# Patient Record
Sex: Female | Born: 1960 | Race: White | Hispanic: No | Marital: Married
Health system: Midwestern US, Community
[De-identification: ages and names within clinical notes are randomized; demographics above are authoritative.]

## PROBLEM LIST (undated history)

## (undated) DIAGNOSIS — R300 Dysuria: Secondary | ICD-10-CM

## (undated) DIAGNOSIS — N958 Other specified menopausal and perimenopausal disorders: Secondary | ICD-10-CM

## (undated) DIAGNOSIS — N950 Postmenopausal bleeding: Secondary | ICD-10-CM

---

## 2017-08-29 NOTE — Telephone Encounter (Signed)
Judy DandyMary would like to transfer her care to you from Dr. Woodroe ChenShivers. Seen you in [facility].     Okay to schedule?

## 2017-08-29 NOTE — Telephone Encounter (Signed)
No, I am not accepting new patients. Sorry!

## 2017-08-30 NOTE — Telephone Encounter (Signed)
Informed.

## 2017-10-21 ENCOUNTER — Ambulatory Visit: Admit: 2017-10-21 | Discharge: 2017-10-21 | Payer: PRIVATE HEALTH INSURANCE | Attending: Physician Assistant

## 2017-10-21 DIAGNOSIS — R079 Chest pain, unspecified: Secondary | ICD-10-CM

## 2017-10-21 LAB — COMPREHENSIVE METABOLIC PANEL
ALT: 21 U/L
AST: 27 U/L
Albumin: 4.5
Alkaline Phosphatase: 45 U/L
Anion Gap: 1.6 mmol/L
BUN: 9 mg/dL
CO2: 27 mmol/L
Calcium: 10.1 mg/dL
Chloride: 104 mmol/L
Creatinine: 0.7
Est, Glom Filt Rate: 86
Glucose: 111 mg/dL — ABNORMAL HIGH
Potassium: 3.6 mmol/L
Sodium: 141 mmol/L
Total Bilirubin: 0.8 mg/dL (ref 0.1–1.4)
Total Protein: 7.3

## 2017-10-21 LAB — CBC
Basophils %: 0.6 %
Basophils Absolute: 0 /uL
Eosinophils %: 0.9 %
Eosinophils Absolute: 0 /uL
Hematocrit: 40.1 % (ref 36–46)
Hemoglobin: 13.7 g/dL (ref 12.0–16.0)
Lymphocytes %: 18.9 % — ABNORMAL LOW
Lymphocytes Absolute: 0.9 /uL — ABNORMAL LOW
MCH: 30.2 pg
MCHC: 34.1 g/dL
MCV: 88.6 fL
MPV: 9 fL
Monocytes %: 6.4 %
Monocytes Absolute: 0.3 /uL
Neutrophils %: 73.2 %
Neutrophils Absolute: 3.3 /uL
Platelets: 202 K/ÂµL
RBC: 4.53 10^6/ÂµL
WBC: 4.6 10^3/mL

## 2017-10-21 NOTE — Progress Notes (Signed)
10/21/17  Judy StackMary Sutton DOB: 1961-01-28 Sex: female  Age 89 y.o.      Subjective:  Chief Complaint   Patient presents with   ??? Shoulder Pain         HPI:   Judy Sutton , 57 y.o. female presents to express care for evaluation of left shoulder pain.  The patient has had this left shoulder pain ongoing for the last week or so.  Seem to get worse yesterday and she has not had any trauma to the left shoulder.  She was recently started on Trintellix for depression/anxiety.  The patient states that she was reading some of the side effects of the medication and it can cause headaches and dizziness.  The patient stopped taking the medication over the last weekend.  She restarted the medication this week and started to develop pain in the left shoulder.  At times it is very intense.  The patient states that she has been intermittently dizzy.  The patient is not having any fevers or chills.  No shortness of breath.  The pain does go into the left upper chest area and down the left arm.  She states that she has a history of hyperlipidemia but does not take any medications for.  She does not have any history of diabetes or hypertension.  No previous CAD history.      ROS:   Unless otherwise stated in this report the patient's positive and negative responses for review of systems for constitutional, eyes, ENT, cardiovascular, respiratory, gastrointestinal, neurological, GU, musculoskeletal, and integument systems and related systems to the presenting problem are either stated in the history of present illness or were not pertinent or were negative for the symptoms and/or complaints related to the presenting medical problem.  Positives and pertinent negatives as per HPI.  All others reviewed and are negative.      PMH:     Past Medical History:   Diagnosis Date   ??? Anxiety    ??? HLD (hyperlipidemia)        History reviewed. No pertinent surgical history.    Family History   Problem Relation Age of Onset   ??? No Known Problems Mother     ??? No Known Problems Father        Medications:     Current Outpatient Medications:   ???  LORazepam (ATIVAN) 0.5 MG tablet, Take by mouth., Disp: , Rfl:   ???  estradiol (CLIMARA) 0.05 MG/24HR, APPLY 1 PATCH ONCE WEEKLY AS DIRECTED, Disp: , Rfl: 4  ???  progesterone (PROMETRIUM) 100 MG capsule, TAKE ONE CAPSULE BY MOUTH EVERY DAY, Disp: , Rfl: 3    Allergies:   No Known Allergies    Social History:     Social History     Tobacco Use   ??? Smoking status: Never Smoker   ??? Smokeless tobacco: Never Used   Substance Use Topics   ??? Alcohol use: Never     Frequency: Never   ??? Drug use: Never       Patient lives at home.    Physical Exam:     Vitals:    10/21/17 1249   BP: 130/72   Pulse: 88   Temp: 97.9 ??F (36.6 ??C)   SpO2: 98%       Exam:  Physical Exam  Nurses note and vital signs reviewed and patient is not hypoxic.    General: The patient appears well and in no apparent distress. Patient is resting comfortably on  cart.  Skin: Warm, dry, no pallor noted. There is no rash noted.  Head: Normocephalic, atraumatic.  Eye: Normal conjunctiva  Ears, Nose, Mouth, and Throat: Oral mucosa is moist  Cardiovascular: Regular Rate and Rhythm  Respiratory: Patient is in no distress, no accessory muscle use, lungs are clear to auscultation, no wheezing, crackles or rhonchi  Back: Non-tender, no CVA tenderness bilaterally to percussion.  GI: Normal bowel sounds, no tenderness to palpation, no masses appreciated. No rebound, guarding, or rigidity noted.  Musculoskeletal: The patient has no evidence of calf tenderness, no pitting edema, symmetrical pulses noted bilaterally  Neurological: A&O x4, normal speech        Testing:     EKG shows normal sinus rhythm, rate of 67, normal axis, no ST segment elevation, there is some depression in lead II, T wave inversion in V1 and V2, baseline artifact in V3.  Incomplete right bundle branch block.  Left atrial enlargement.  Normal intervals.      Medical Decision Making:     The patient had a relatively  benign physical exam.  Vital signs reviewed and noted to be within normal limits.  Will obtain EKG.    The patient's EKG does show some evidence of ST segment depression in lead II as well as T wave inversion V1 and V2.  The patient is having some atypical signs and symptoms.  She is dizzy.  Based on the nature of the patient's symptoms we do not have an old EKG to compare to we will send the patient to the emergency department for further evaluation and rule out ACS.  I offered to call the patient and EMS transport.  The patient is declining at this time.  The patient will transport herself to the ED.  She was comfortable with this plan.  Patient has no other questions at this time.      Clinical Impression:   [name] was seen today for shoulder pain.    Diagnoses and all orders for this visit:    Chest pain, unspecified type  -     EKG 12 lead; Future  -     EKG 12 lead    Acute pain of left shoulder    Dizziness      go directly to the emergency department.     SIGNATURE: Alben Spittle III, PA-C

## 2017-10-25 NOTE — Telephone Encounter (Signed)
Pt was seen in walk in care and sent to [facility] ER on 8/23 due to lt shoulder pain, dizziness, and abnormal EKG, was told to d/c anti-depressant because symptoms were possible due to med.  Pt scheduled appt at next available appt slot 9/17 but requests sooner appt if possible.  She was added to the wait list.

## 2017-11-15 ENCOUNTER — Ambulatory Visit: Admit: 2017-11-15 | Discharge: 2017-11-15 | Payer: PRIVATE HEALTH INSURANCE | Attending: Family Medicine

## 2017-11-15 DIAGNOSIS — R079 Chest pain, unspecified: Secondary | ICD-10-CM

## 2017-11-15 NOTE — Progress Notes (Signed)
11/15/2017     Judy Sutton    DOB: [birth year] Sex: female   Age: 57 y.o.    Chief Complaint   Patient presents with   . Follow-Up from [facility]     [facility] for dizziness, shoulder pain, abnormal ekg,       HPI: This 57 y.o. -year-old female  presents today for follow-up evaluation from a recent emergency room visit.  The patient was sent to the emergency room through express care with an abnormal EKG and atypical signs and symptoms of ACS.  The patient was discharged with a diagnosis of noncardiac chest pain as well as left shoulder pain.  The patient states the pain has resolved.  Current medication list reviewed. The patient is tolerating all medications well without adverse events or known side effects. The patient does understand the risk and benefits of the prescribed medications. The patient is up-to-date on all age-appropriate wellness issues.      ROS:   Const: Denies changes in appetite, chills, fever, night sweats and weight loss.   Eyes:  Denies discharge, a recent change in visual acuity, blurred vision and double vision.  ENMT: Denies discharge of the ears, hearing loss, pain of the ears. Denies nasal or sinus symptoms other than stated above.  Denies mouth or throat symptoms.  CV:  Denies chest pain, dyspnea on exertion, orthopnea, palpitations and PND  Resp: Denies chest pain, cough, SOB and wheezing.  GI: Denies abdominal pain, constipation, diarrhea, heartburn, indigestion, nausea and vomiting.   GU: Denies dysuria, frequency, hematuria, nocturia and urgency.  Musculo: Denies arthralgias and myalgia  Skin:  Denies lesions, pruritus and rash.  Neuro: Denies dizziness, lightheadedness, numbness, tingling and weakness.  Psych:  Denies anxiety and depression  Endocrine: Denies anxiety and depression.  Hema/Lymph: Denies hematologic symptoms  Allergy/Immuno:  Denies allergic/immunologic symptoms.  Pertinent positives reviewed and noted      Current Outpatient Medications:   .  estradiol (VIVELLE) 0.05  MG/24HR, APPLY ONE PATCH TWO TIMES PER WEEK. LEAVE PATCH ON FOR 3 DAYS AND THEN REPLACE, Disp: , Rfl: 4  .  nitrofurantoin, macrocrystal-monohydrate, (MACROBID) 100 MG capsule, Take 100 mg by mouth 2 times daily, Disp: , Rfl:   .  phenazopyridine (PYRIDIUM) 200 MG tablet, Take 200 mg by mouth every 8 hours For 2 days, Disp: , Rfl:   .  LORazepam (ATIVAN) 0.5 MG tablet, Take by mouth., Disp: , Rfl:   .  estradiol (CLIMARA) 0.05 MG/24HR, APPLY 1 PATCH ONCE WEEKLY AS DIRECTED, Disp: , Rfl: 4  .  progesterone (PROMETRIUM) 100 MG capsule, TAKE ONE CAPSULE BY MOUTH EVERY DAY, Disp: , Rfl: 3    No Known Allergies    Past Medical History:   Diagnosis Date   . Anxiety    . HLD (hyperlipidemia)      Social History     Socioeconomic History   . Marital status: Married     Spouse name: Not on file   . Number of children: Not on file   . Years of education: Not on file   . Highest education level: Not on file   Occupational History   . Not on file   Social Needs   . Financial resource strain: Not on file   . Food insecurity:     Worry: Not on file     Inability: Not on file   . Transportation needs:     Medical: Not on file     Non-medical: Not on file  Tobacco Use   . Smoking status: Never Smoker   . Smokeless tobacco: Never Used   Substance and Sexual Activity   . Alcohol use: Never     Frequency: Never   . Drug use: Never   . Sexual activity: Not on file   Lifestyle   . Physical activity:     Days per week: Not on file     Minutes per session: Not on file   . Stress: Not on file   Relationships   . Social connections:     Talks on phone: Not on file     Gets together: Not on file     Attends religious service: Not on file     Active member of club or organization: Not on file     Attends meetings of clubs or organizations: Not on file     Relationship status: Not on file   . Intimate partner violence:     Fear of current or ex partner: Not on file     Emotionally abused: Not on file     Physically abused: Not on file      Forced sexual activity: Not on file   Other Topics Concern   . Not on file   Social History Narrative   . Not on file     No past surgical history on file.   Family History   Problem Relation Age of Onset   . Heart Failure Mother    . Diabetes Mother         Vitals:    11/15/17 0913   BP: 126/78   Pulse: 62   Resp: 16   SpO2: 98%   Weight: 121 lb (54.9 kg)   Height: 5\' 6"  (1.676 m)        Exam: Const: Appears healthy and well developed.  No signs of acute distress present.  Eyes: PERRL  ENMT: Tympanic membranes are intact.  Nasal mucosa intact without noted erythema Septum is in the midline.  Posterior pharynx shows no exudate, irritation or redness.  Neck:  Supple without adenopathy.  Adequate range of motion   Resp: No rales, rhonchi, wheezes appreciated over the lungs bilaterally.   CV: S1, S2 within normal limits.  Regular rate and rhythm noted.  Without murmur, gallop or rub.     Extremities:  Pulses intact.  Without noted edema.  Abdomen: Positive bowel sounds.  Palpation reveals softness, with no distension, organomegaly or tenderness.  No abdominal masses palpable.  Skin: Skin is warm and dry.  Musculo: Unremarkable upon examination.  Neuro: Alert and oriented X3.  Cranial nerves grossly intact.   Psych: Mood is normal.  Affect is normal.   Vital signs reviewed.      Controlled Substances Monitoring:     No flowsheet data found.     Plan Per Assessment:  There are no diagnoses linked to this encounter.    Return in about 7 months (around 06/16/2018) for Wellness examination.    Godson Pollan Daine Floras, MD    Note was generated with the assistance of voice recognition software.  Document was reviewed however may contain grammatical errors.

## 2017-12-06 NOTE — Telephone Encounter (Signed)
Pt seen in the office 11/15/17.

## 2018-08-30 ENCOUNTER — Ambulatory Visit: Admit: 2018-08-30 | Discharge: 2018-08-30 | Payer: PRIVATE HEALTH INSURANCE | Attending: Physician Assistant

## 2018-08-30 DIAGNOSIS — R42 Dizziness and giddiness: Secondary | ICD-10-CM

## 2018-08-30 LAB — POCT GLUCOSE: Glucose: 98 mg/dL

## 2018-08-30 NOTE — Telephone Encounter (Signed)
Pend referral.  Okay for EKG.

## 2018-08-30 NOTE — Telephone Encounter (Signed)
Patient called her cardiologist it has been a couple of years since she was there.  They are requesting a referral.  [facility] Dr. Gardiner Rhyme.  Fax number is [phone].  They also would like EKG done today.    Electronically signed by Carylon Perches, LPN on 06/30/8634 at 9:45 AM

## 2018-08-30 NOTE — Progress Notes (Deleted)
08/30/2018   [facility] [place]  [address]  [place] [address]  [phone]    Judy Sutton  DOB: Jan 28, 1961  Age: 58 y.o.  Sex: female      Subjective:  Chief Complaint   Patient presents with   ??? Dizziness       HPI: The patient states that they have been dizzy for the last *** days. The dizziness is worse with movement. No nausea and vomiting. No numbness, tingling, or weakness to the extremities. No headache or visual disturbances. No recent head injury. Bowel movements have been normal color and consistency. No diarrhea. No black or bloody stools. The patient denies dysuria, urinary frequency, urgency and or gross hematuria. No flank pain. No fevers or chills. No chest pain or dyspnea. They come to the urgent care for evaluation.    ROS:  Positive and pertinent negatives as per HPI.  All other systems are reviewed and negative.       Current Outpatient Medications:   ???  estradiol (VIVELLE) 0.05 MG/24HR, APPLY ONE PATCH TWO TIMES PER WEEK. LEAVE PATCH ON FOR 3 DAYS AND THEN REPLACE, Disp: , Rfl: 4  ???  LORazepam (ATIVAN) 0.5 MG tablet, Take by mouth., Disp: , Rfl:   ???  estradiol (CLIMARA) 0.05 MG/24HR, APPLY 1 PATCH ONCE WEEKLY AS DIRECTED, Disp: , Rfl: 4  ???  progesterone (PROMETRIUM) 100 MG capsule, TAKE ONE CAPSULE BY MOUTH EVERY DAY, Disp: , Rfl: 3   No Known Allergies     Objective:  Vitals:    08/30/18 0831   BP: 120/80   Pulse: 89   Temp: 98.7 ??F (37.1 ??C)   TempSrc: Temporal   SpO2: 98%   Weight: 117 lb (53.1 kg)        Exam:  Const: Appears healthy and well developed. No signs of acute distress present.  Vitals reviewed per triage.  Head/Face: Normocephalic, atraumatic.  Facies is symmetric.  Eyes: Pupils are equal, round, and reactive to light.  Extraocular movements are intact.   ENMT: Tympanic membranes are pearly gray with good light reflex bilaterally.  Nares are patent. Buccal mucosa was moist.   Posterior pharynx shows no exudate, irritation or redness.   Neck: Supple and symmetric. Palpation  reveals no adenopathy. Trachea midline.  Resp: Lungs are clear bilaterally.  CV: Rhythm is regular. S1 is normal. S2 is normal. Extremities: No edema of the lower limbs bilaterally.  Abdomen: Abdomen soft, nontender to palpation.  No masses or organomegaly.  No rebound or guarding.     Musculo: Walks with a normal gait. Patient moves extremities without pain or limitation.  Muscle strength is strong and equal bilaterally.    Skin: Skin is warm and dry.  Neuro: Alert and oriented x3. Speech is articulate and fluent.No focal neurologic deficits.  Psych: Mood and affect are appropriate.    Judy Sutton was seen today for dizziness.    Diagnoses and all orders for this visit:    Dizziness  -     POCT Glucose        No follow-ups on file.    Seen By:    Georgiann Cocker, PA-C

## 2018-08-30 NOTE — Telephone Encounter (Signed)
Patient was in to urgent care and advised to go to ER. Patient had refused.     She called back later requesting referral to cardiologist.  Patient informed she needs to go to ER. Still refuses.  [facility] informed.    Electronically signed by Carylon Perches, LPN on 03/09/6343 at 10:40 AM

## 2018-08-30 NOTE — Progress Notes (Signed)
08/30/2018   [name]  [address]  [place] [address]  [phone]    Judy Sutton  DOB: 1960-04-09  Age: 58 y.o.  Sex: female      Subjective:  Chief Complaint   Patient presents with   ??? Dizziness       HPI: The patient states that they have been dizzy for many months but the dizziness has changed in the last 2 to 3 weeks.  Patient states she is struggled with dizziness for close to a year usually with position changes.  However in the last 2 or 3 weeks she states that not only has she gotten dizzy with position changes but she has also gotten extremely lightheaded multiple times when she felt as though she would actually pass out and having very mild chest discomfort in her left anterior chest.  Patient states yesterday she felt as though she may pass out 3 separate occasions.  Is unable to recall exactly what she was doing when she felt as though she may pass out.  The last time she had mild chest discomfort was this morning on her way into express care.  Patient states it was a very mild tight feeling that lasted for approximately 10 or 15 minutes.  During this 10 to 15 minutes when she was driving experienced chest tightness she denies that she had any associated symptoms.  Patient denies that she has been on any new medications or supplements.  She has not had any changes in her daily routine.. The dizziness is worse with movement. No nausea and vomiting. No numbness, tingling, or weakness to the extremities.  Patient reports slight headache but no visual disturbances. No recent head injury. Bowel movements have been normal color and consistency. No diarrhea. No black or bloody stools. The patient denies dysuria, urinary frequency, urgency and or gross hematuria. No flank pain. No fevers or chills. No  dyspnea. They come to the urgent care for evaluation.    ROS:  Positive and pertinent negatives as per HPI.  All other systems are reviewed and negative.       Current Outpatient Medications:   ???   estradiol (VIVELLE) 0.05 MG/24HR, APPLY ONE PATCH TWO TIMES PER WEEK. LEAVE PATCH ON FOR 3 DAYS AND THEN REPLACE, Disp: , Rfl: 4  ???  LORazepam (ATIVAN) 0.5 MG tablet, Take by mouth., Disp: , Rfl:   ???  estradiol (CLIMARA) 0.05 MG/24HR, APPLY 1 PATCH ONCE WEEKLY AS DIRECTED, Disp: , Rfl: 4  ???  progesterone (PROMETRIUM) 100 MG capsule, TAKE ONE CAPSULE BY MOUTH EVERY DAY, Disp: , Rfl: 3   No Known Allergies     Objective:  Vitals:    08/30/18 0831   BP: 120/80   Pulse: 89   Temp: 98.7 ??F (37.1 ??C)   TempSrc: Temporal   SpO2: 98%   Weight: 117 lb (53.1 kg)        Exam:  Const: Appears healthy and well developed. No signs of acute distress present.  Vitals reviewed per triage.  Head/Face: Normocephalic, atraumatic.  Facies is symmetric.  Eyes: Pupils are equal, round, and reactive to light.  Extraocular movements are intact.   ENMT: Tympanic membranes are pearly gray with good light reflex bilaterally.  Nares are patent. Buccal mucosa was moist.   Posterior pharynx shows no exudate, irritation or redness.   Neck: Supple and symmetric. Palpation reveals no adenopathy. Trachea midline.  Resp: Lungs are clear bilaterally.  CV: Rhythm is regular.  S1 is normal. S2 is normal. Extremities: No edema of the lower limbs bilaterally.  Abdomen: Abdomen soft, nontender to palpation.  No masses or organomegaly.  No rebound or guarding.     Musculo: Walks with a normal gait. Patient moves extremities without pain or limitation.  Muscle strength is strong and equal bilaterally.    Skin: Skin is warm and dry.  Neuro: Alert and oriented x3. Speech is articulate and fluent.No focal neurologic deficits.  Psych: Mood and affect are appropriate.    Judy DandyMary was seen today for dizziness.    Diagnoses and all orders for this visit:    Dizziness  -     POCT Glucose  -     EKG 12 lead; Future  -     EKG 12 lead    Pre-syncope    Chest tightness        EKG done here in the office.  Shows a sinus rhythm of 60.  Normal axis.  No ST segment elevation.   There is T wave inversions in V1 V2 and aVL.  Incomplete right bundle branch block is also noted.  This was compared to an EKG that was done in August 2019 and is unchanged.      Patient states she has seen cardiology in the past.  She states that she has been diagnosed with mitral valve prolapse but never followed up with cardiology after the abnormal EKG that was done in August 2019.  I did discuss with the patient that given her change in dizziness with the chest discomfort, I recommend the emergency department for further evaluation and treatment.  I did give patient a copy of her EKG.  I was under the impression that patient was driving herself to the ED for further evaluation and treatment.  However after patient left she called the office stating she tried to get into see her cardiologist instead.  It is been several years since she has seen cardiology and they are requiring a referral.  With this we did call the patient back and advised the ED for her current symptoms.  Told patient I am unwilling to do referral to cardiology as I believe this is urgent and she needs to go to the ED rather than wait for a cardiology appointment.  Patient states that she is uncertain what she will do at this point but she will consider going to the ED.  Pt refusal to go to ED was verified by Myriam JacobsonHelen, LPN.    Seen By:    Worthy RancherMELISSA J Leba Tibbitts, PA-C

## 2019-01-18 ENCOUNTER — Telehealth

## 2019-01-18 NOTE — Telephone Encounter (Signed)
[facility] sent for [place] to pend orders.      Orders pended for approval

## 2019-03-13 ENCOUNTER — Encounter

## 2019-03-14 LAB — ESTRADIOL: Estradiol: 29.5 pg/mL

## 2019-03-14 LAB — VITAMIN D 25 HYDROXY: Vit D, 25-Hydroxy: 41 ng/mL (ref 30–100)

## 2019-03-14 LAB — CORTISOL TOTAL: Cortisol: 22.82 ug/dL — ABNORMAL HIGH (ref 2.68–18.40)

## 2019-03-14 LAB — TSH: TSH: 6.48 u[IU]/mL — ABNORMAL HIGH (ref 0.270–4.200)

## 2019-03-15 LAB — TESTOSTERONE, FREE, TOTAL
Sex Hormone Binding: 134 nmol/L (ref 30–135)
Testosterone, Free: <1 pg/mL (ref 0.6–3.8)
Testosterone: 14 ng/dL — ABNORMAL LOW (ref 20–70)

## 2019-03-16 LAB — 17-HYDROXYPROGESTERONE: 17-OH Progesterone LCMS: 54.87 ng/dL (ref <=206.00)

## 2019-03-20 LAB — ESTRONE: Estrone: 39.1 pg/mL

## 2020-03-31 IMAGING — MR MRI CERVICAL SPINE WITHOUT CONTRAST
6 series · 38 of 48 positions shown · non-contrast
Comparison: none

﻿

Pertinent Hx:  Neck pain.
TECHNIQUE: Sagittal images with T1, T2, and STIR weighting are performed through the cervical spine. Axial images with T1 and T2 weighting are performed through the C2-3, C3-4, C4-5, C5-6, C6-7, and C7-T1 disc spaces.

[Series 2: T2 · sagittal · 3.0mm · 0.43mm/px · 6 of 15 slices shown (1 of 3)]
[im 1/15]
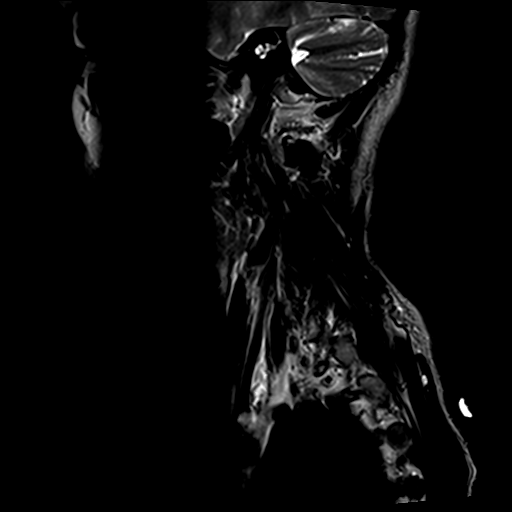
[im 3/15]
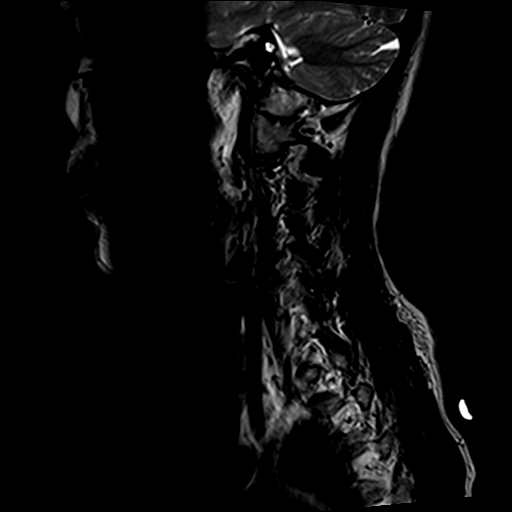
[im 6/15]
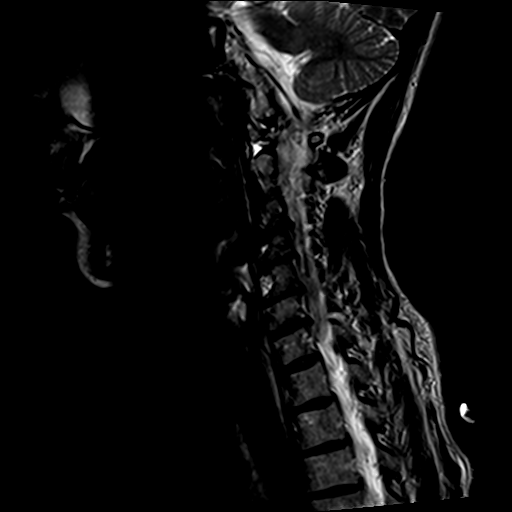
[im 9/15]
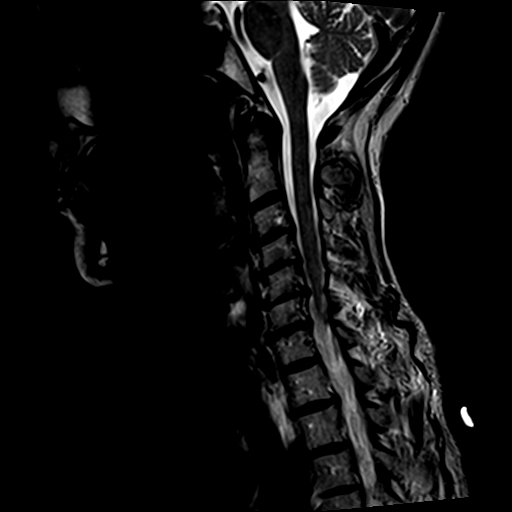
[im 12/15]
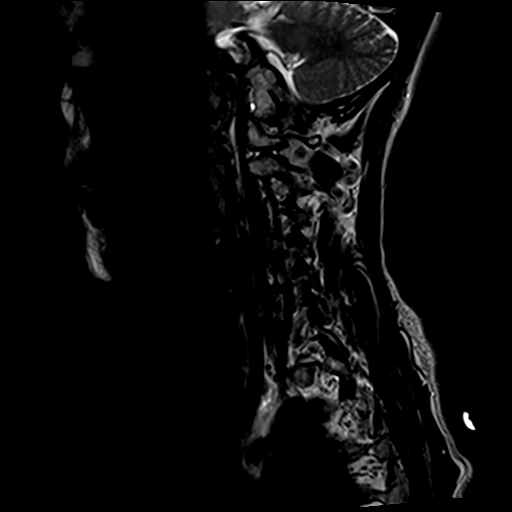
[im 15/15]
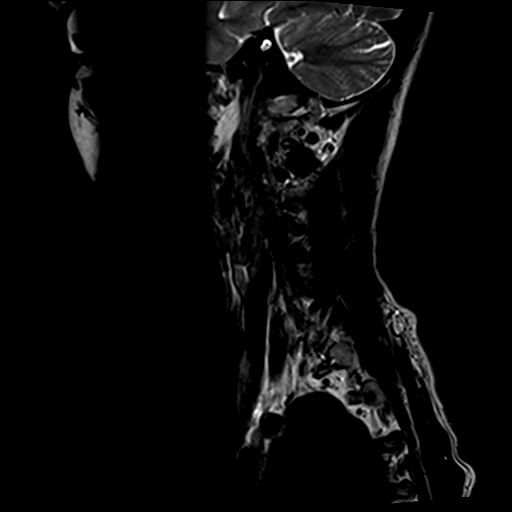

[Series 3: STIR · sagittal · 3.0mm · 0.43mm/px · 2 of 15 slices shown]
[im 1/15]
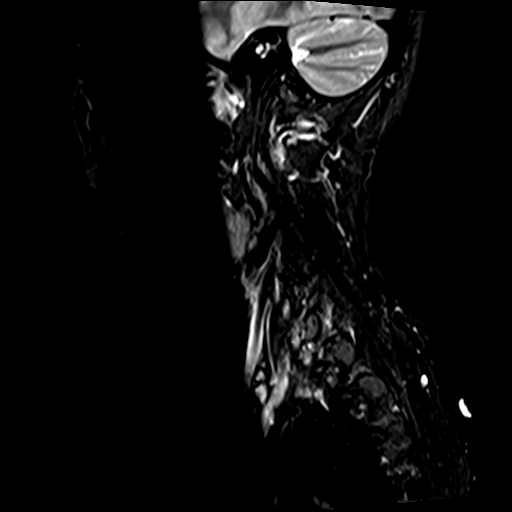
[im 3/15]
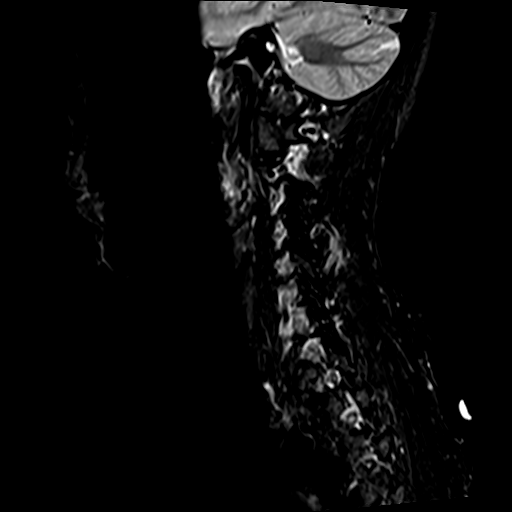

[Series 4: T2 · axial · 4.0mm · 0.59mm/px · z∈[-102,-5]mm · 8 of 26 slices shown (2 of 3)]
[im 1/26]
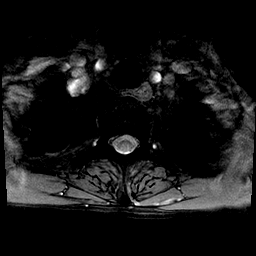
[im 3/26]
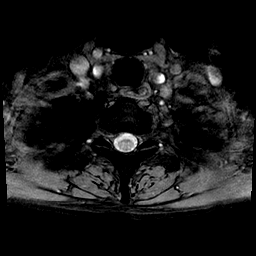
[im 9/26]
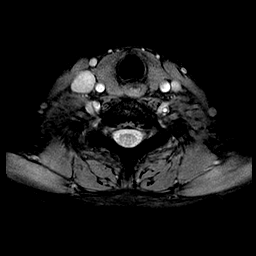
[im 12/26]
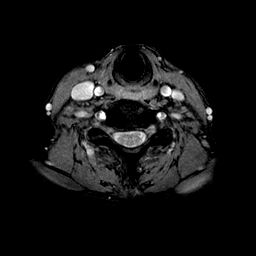
[im 14/26]
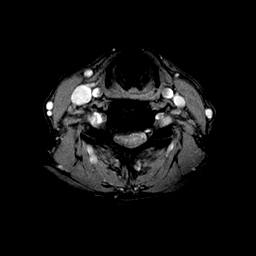
[im 17/26]
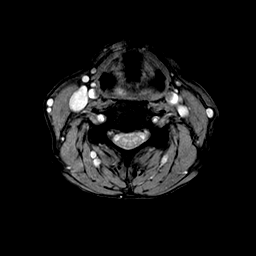
[im 23/26]
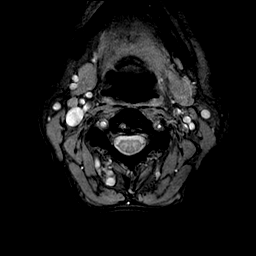
[im 26/26]
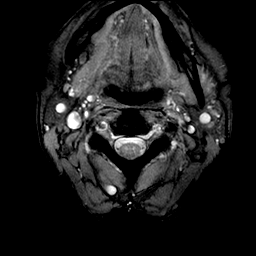

[Series 5: T1 · axial · 4.0mm · 0.59mm/px · z∈[-102,-5]mm · 8 of 26 slices shown (1 of 2)]
[im 1/26]
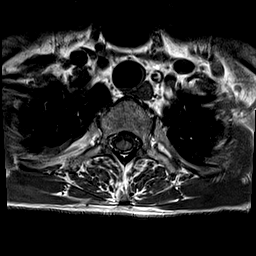
[im 3/26]
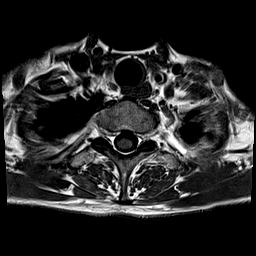
[im 9/26]
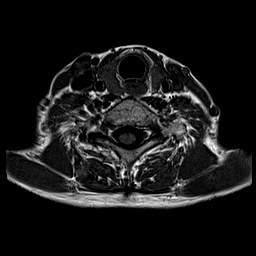
[im 12/26]
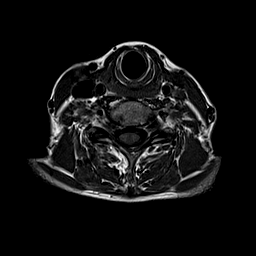
[im 14/26]
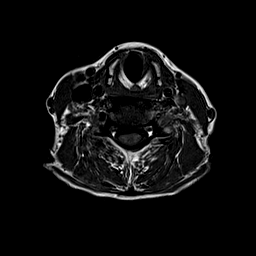
[im 17/26]
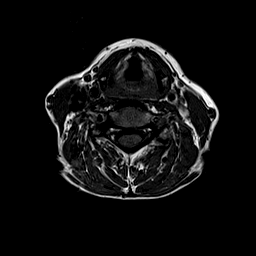
[im 23/26]
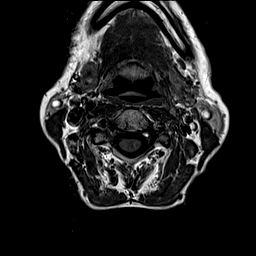
[im 26/26]
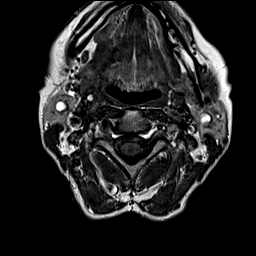

[Series 6: T2 · axial · 4.0mm · 0.47mm/px · z∈[-102,-5]mm · 8 of 26 slices shown (3 of 3)]
[im 1/26]
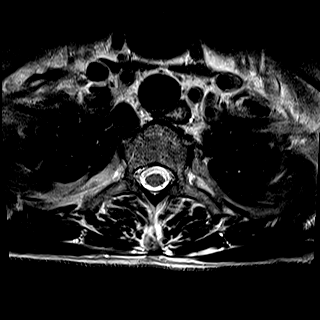
[im 3/26]
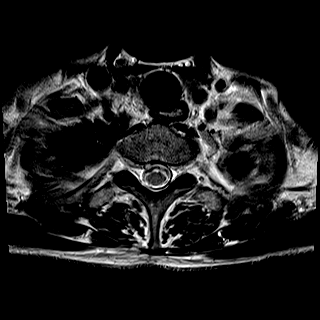
[im 9/26]
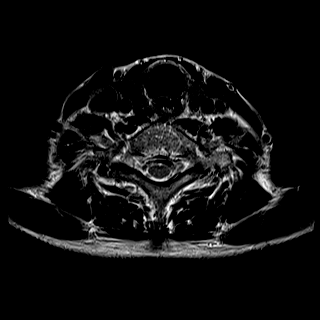
[im 12/26]
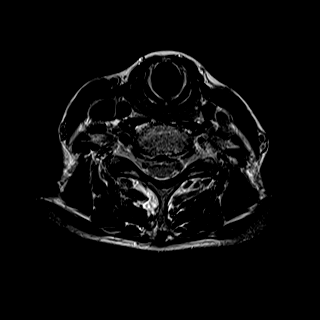
[im 14/26]
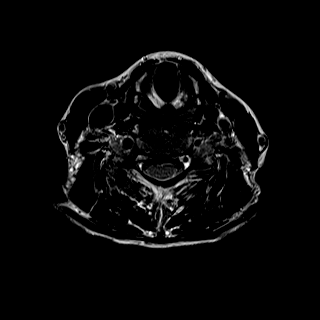
[im 17/26]
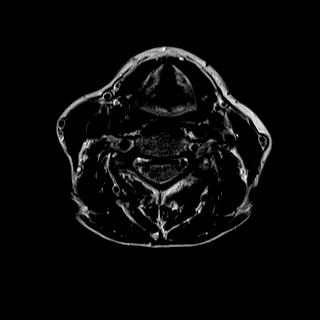
[im 23/26]
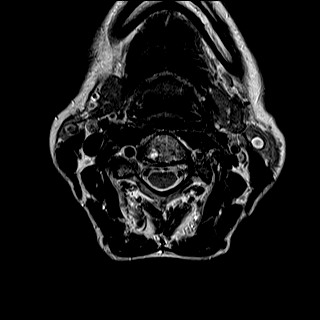
[im 26/26]
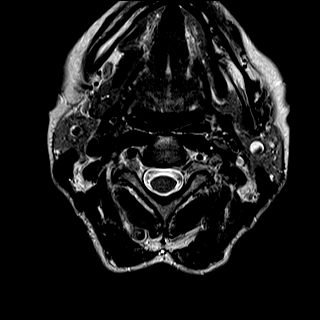

[Series 7: T1 · sagittal · 3.0mm · 0.69mm/px · 6 of 15 slices shown (2 of 2)]
[im 1/15]
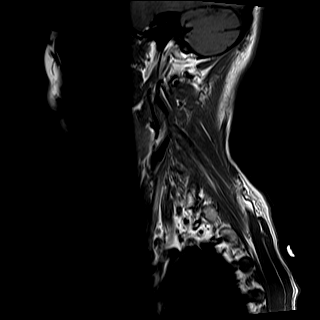
[im 3/15]
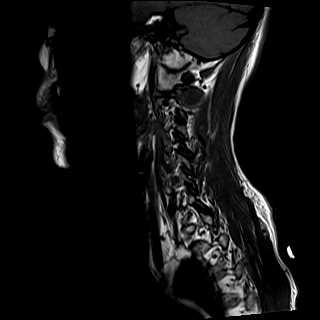
[im 6/15]
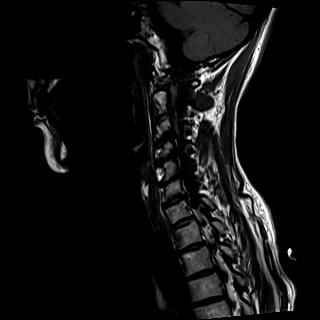
[im 9/15]
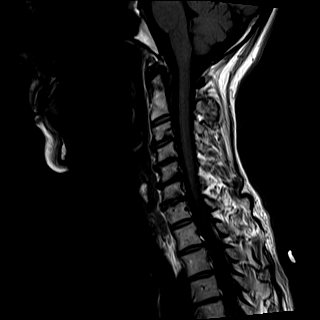
[im 12/15]
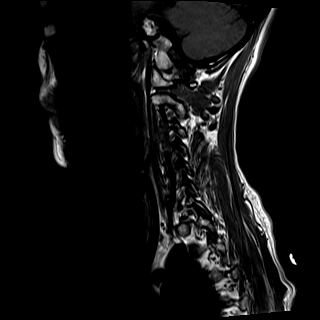
[im 15/15]
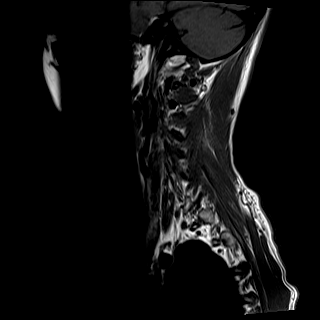

[38 of 48 positions shown; findings below may reference images not displayed]

FINDINGS: There is dramatic straightening to the normal cervical curve.  

C2-C3 is otherwise normal.  

A broad-based disc bulge at C3-C4 produces no significant canal or foraminal stenosis.  

At C4-C5, there is mild right foraminal stenosis due to right uncovertebral joint hypertrophy.  Image 9 series 4. 

At C5-C6, a broad-based midline and right-sided disc herniation results in mild midline and right neural canal stenosis.  The herniation extends into the right C5-C6 foramen to potentially efface the exiting right C6 nerve root.  Image 13 series 4.  No myelomalacia is present.  

At C6-C7, a broad-based midline and left posterior herniation effaces the subarachnoid space resulting in mild midline and left neural canal stenosis.  Image 16 series 4.  

C7-T1 is normal.
IMPRESSION: Abnormal due to multilevel spondylitic change which is present principally from C4-C5 to C6-C7 as described in the text of the report.  There is significant straightening to the normal cervical curve.

## 2020-03-31 IMAGING — MR MRI LUMBAR SPINE WITHOUT CONTRAST
5 series · 48 of 48 positions shown · non-contrast
Comparison: none

﻿

Pertinent Hx:  Bilateral leg pain and numbness.
TECHNIQUE: Sagittal images with T1, T2, and STIR weighting are performed through the lumbar spine. Axial images with T1 weighting are performed consecutively from L2 to S1. Additional axials with T2 weighting are performed from L1-L2 through L5-S1.

[Series 2: T2 · sagittal · 3.5mm · 0.81mm/px · 8 of 15 slices shown (1 of 2)]
[im 1/15]
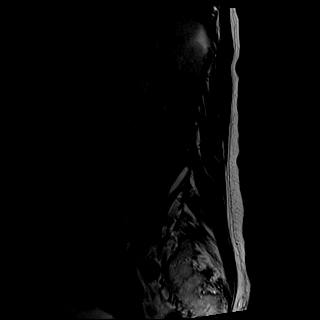
[im 3/15]
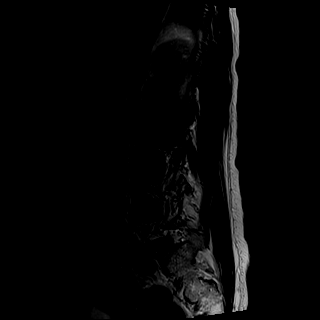
[im 5/15]
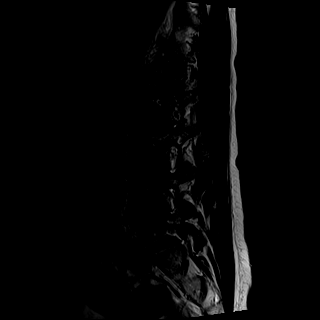
[im 7/15]
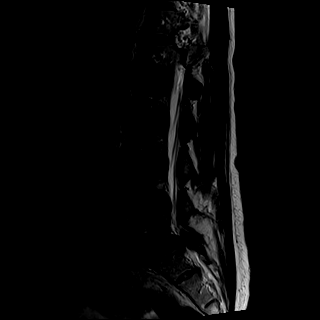
[im 9/15]
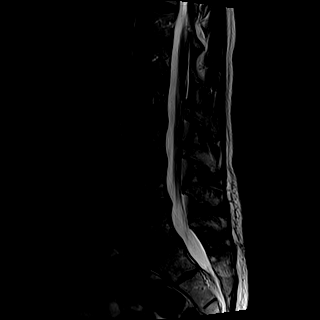
[im 11/15]
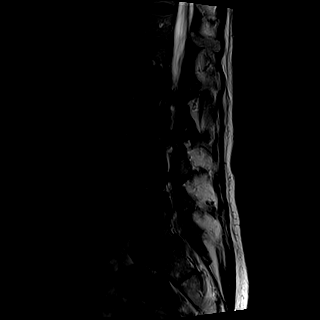
[im 13/15]
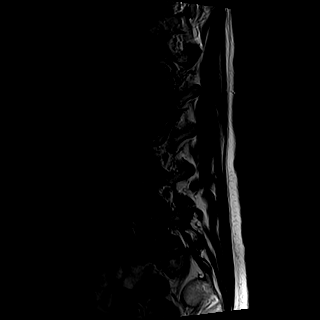
[im 15/15]
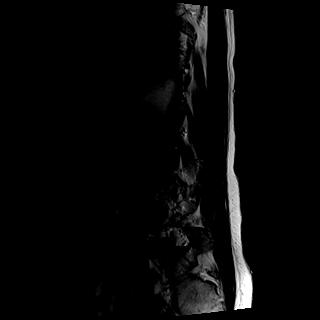

[Series 3: T1 · sagittal · 3.5mm · 0.81mm/px · 7 of 15 slices shown (1 of 2)]
[im 1/15]
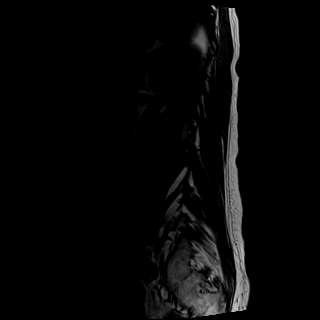
[im 3/15]
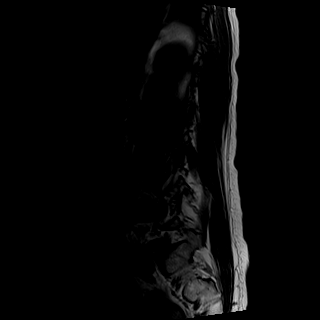
[im 5/15]
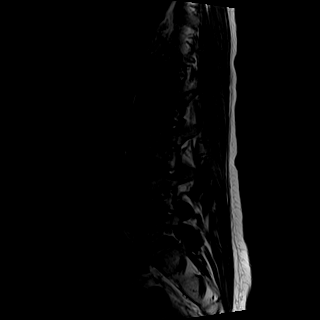
[im 8/15]
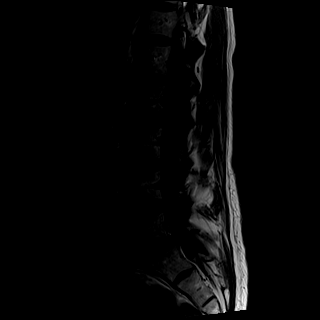
[im 10/15]
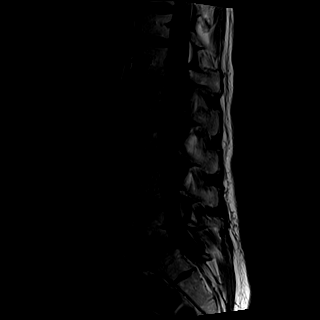
[im 12/15]
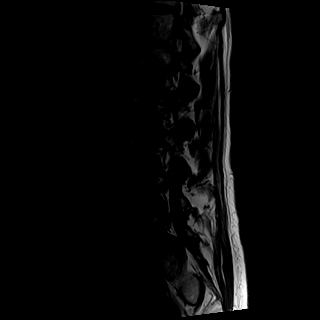
[im 15/15]
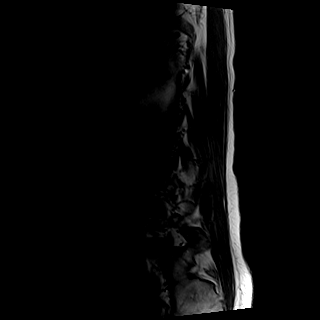

[Series 5: T1 · axial · 4.0mm · 0.66mm/px · z∈[-49,+126]mm · 13 of 26 slices shown (2 of 2)]
[im 1/26]
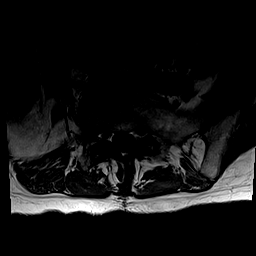
[im 3/26]
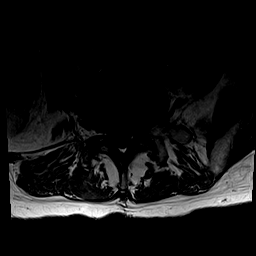
[im 5/26]
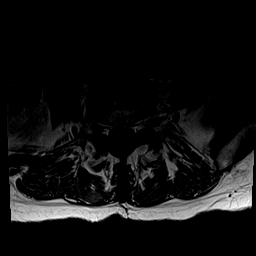
[im 7/26]
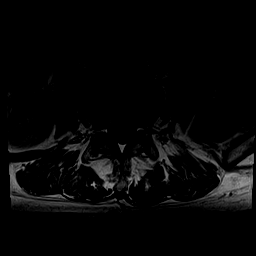
[im 9/26]
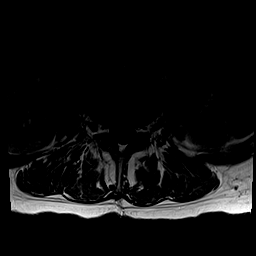
[im 11/26]
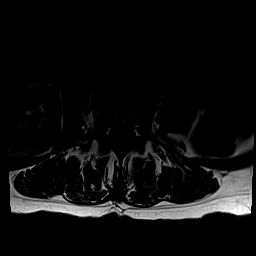
[im 13/26]
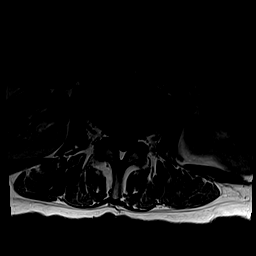
[im 15/26]
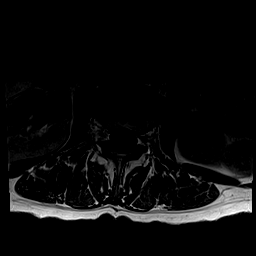
[im 17/26]
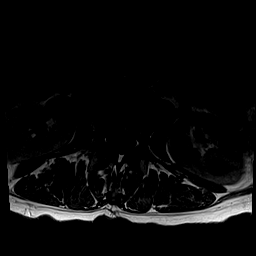
[im 19/26]
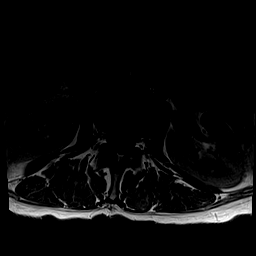
[im 21/26]
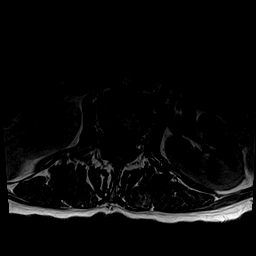
[im 23/26]
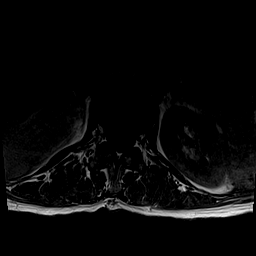
[im 26/26]
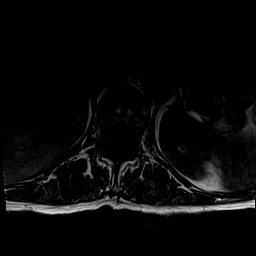

[Series 6: T2 · axial · 4.0mm · 0.66mm/px · z∈[-49,+126]mm · 13 of 26 slices shown (2 of 2)]
[im 1/26]
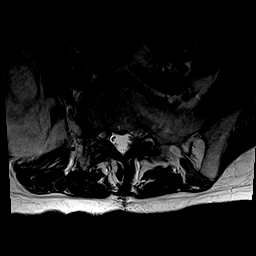
[im 3/26]
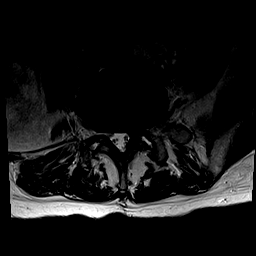
[im 5/26]
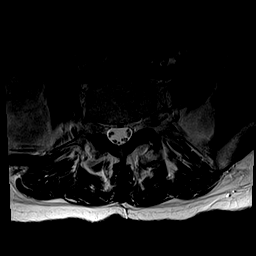
[im 7/26]
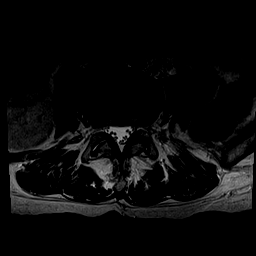
[im 9/26]
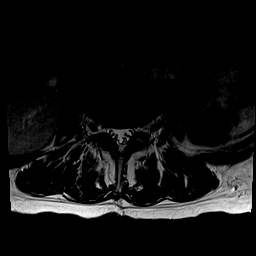
[im 11/26]
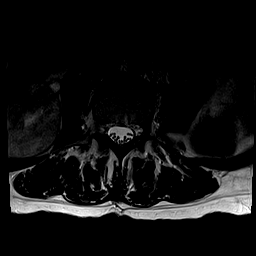
[im 13/26]
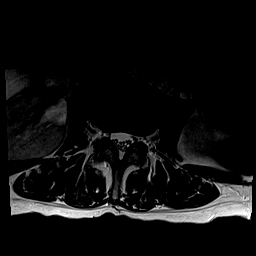
[im 15/26]
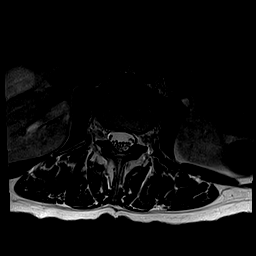
[im 17/26]
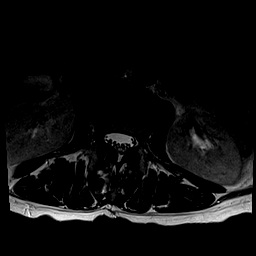
[im 19/26]
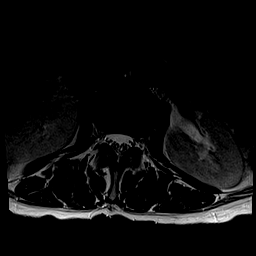
[im 21/26]
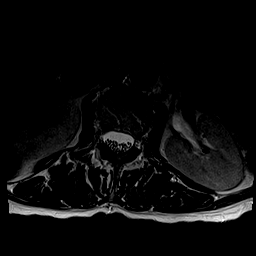
[im 23/26]
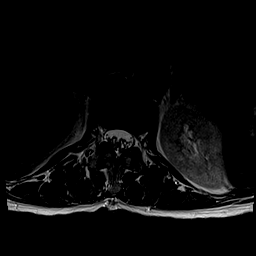
[im 26/26]
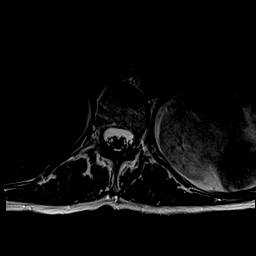

[Series 7: STIR · sagittal · 3.5mm · 1.02mm/px · 7 of 15 slices shown]
[im 1/15]
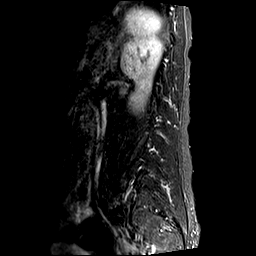
[im 3/15]
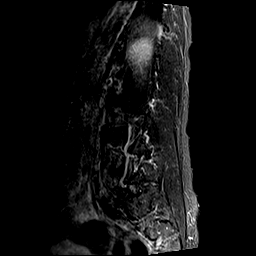
[im 5/15]
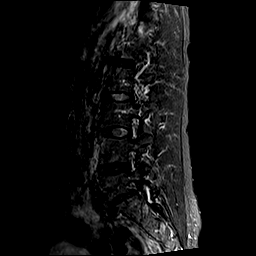
[im 8/15]
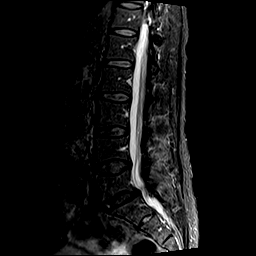
[im 10/15]
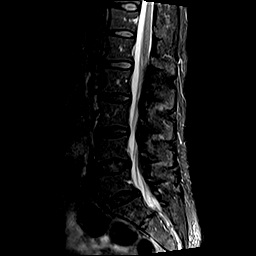
[im 12/15]
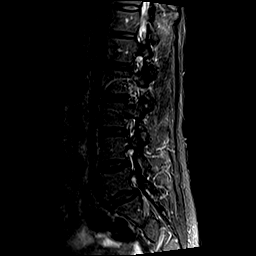
[im 15/15]
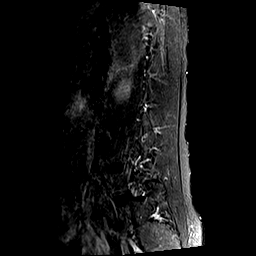

[48 of 48 positions shown; findings below may reference images not displayed]

FINDINGS: L1 to L4 is normal. 

At L4-L5, there is a smooth, broad-based disc bulge that results in mild midline neural canal stenosis.  Image 18 series 6, image 8 series 2.  

At L5-S1, there is a broad-based paramedian disc herniation that extends into the left L5-S1 foramen.  There is mild midline neural canal stenosis and significant left foraminal stenosis at L5-S1.  Image 23 series 6.  Very significant bilateral facet arthropathy is present at L5-S1 which results principally in aggravating the left L5-S1 foraminal stenosis.  Images 23 and 24 of series 6.
IMPRESSION: Abnormal due to multilevel degenerative change principally from L3 to the sacrum as defined in the text of the report.

## 2020-04-14 ENCOUNTER — Ambulatory Visit: Admit: 2020-04-14 | Discharge: 2020-04-14 | Payer: PRIVATE HEALTH INSURANCE | Attending: Family

## 2020-04-14 ENCOUNTER — Encounter

## 2020-04-14 DIAGNOSIS — N3001 Acute cystitis with hematuria: Secondary | ICD-10-CM

## 2020-04-14 LAB — POCT URINALYSIS DIPSTICK
Bilirubin, UA: NEGATIVE
Glucose, UA POC: NEGATIVE
Ketones, UA: NEGATIVE
Nitrite, UA: NEGATIVE
Protein, UA POC: NEGATIVE
Spec Grav, UA: 1.015
Urobilinogen, UA: 0.2
pH, UA: 6.5

## 2020-04-14 MED ORDER — PHENAZOPYRIDINE HCL 100 MG PO TABS
100 MG | ORAL_TABLET | Freq: Three times a day (TID) | ORAL | 0 refills | Status: AC | PRN
Start: 2020-04-14 — End: 2020-04-16

## 2020-04-14 MED ORDER — NITROFURANTOIN MONOHYD MACRO 100 MG PO CAPS
100 MG | ORAL_CAPSULE | Freq: Two times a day (BID) | ORAL | 0 refills | Status: DC
Start: 2020-04-14 — End: 2020-04-17

## 2020-04-14 NOTE — Progress Notes (Signed)
April 15, 2020     Judy Sutton 60 y.o. female    DOB: [birth year]  Chief Complaint:   Dysuria      History of Present Illness   Source of history provided by:  patient.    Judy Sutton is a 60 y.o. old female who presents [facility] the [facility] with complaints of dysuria x 2 days. Reports associated frequency, urgency, and suprapubic pressure. Denies gross hematuria.  Denies associated flank pain. Denies any fever, chills, vaginal discharge, vaginal bleeding, possibility of pregnancy, vomiting, diarrhea, or lethargy.  No LMP recorded.    ROS   Past Medical History:   Past Medical History:   Diagnosis Date   ??? Anxiety    ??? HLD (hyperlipidemia)      Past Surgical History: History reviewed. No pertinent surgical history.  Social History:  reports that she has never smoked. She has never used smokeless tobacco. She reports that she does not drink alcohol and does not use drugs.  Family History: family history includes Diabetes in her mother; Heart Failure in her mother.   Allergies: Patient has no known allergies.    Unless otherwise stated in this report the patient's positive and negative responses for review of systems for constitutional, eyes, ENT, cardiovascular, respiratory, gastrointestinal, neurological, GU, musculoskeletal, and integument systems and related systems to the presenting problem are either stated in the history of present illness or were not pertinent or were negative for the symptoms and/or complaints related to the presenting medical problem.  Positives and pertinent negatives as per HPI.  All others reviewed and are negative.    Physical Exam   VS:   Vitals:    04/14/20 1558   BP: 122/64   Pulse: 64   Resp: 20   Temp: 98.2 ??F (36.8 ??C)   TempSrc: Temporal   SpO2: 95%   Weight: 120 lb (54.4 kg)   Height: 5\' 6"  (1.676 m)     Oxygen Saturation Interpretation: Normal.    Constitutional:  A&Ox3, development consistent with age, NAD.  Lungs:  CTAB without wheezing, rales, or rhonchi.  Heart:  RRR  without pathologic murmurs, rubs, or gallops.  Abdomen: Soft, nondistended, with mild suprapubic tenderness. No rebound, rigidity, or guarding. BS+ X4. No organomegaly.     Back: No CVA tenderness.  Skin:  Normal turgor.  Warm, dry, without visible rash, unless noted elsewhere.  Neurological:  Alert and oriented.  Motor functions intact.  Responds to verbal commands.    Lab / Imaging Results   All laboratory and radiology results have been personally reviewed by myself.  Labs:  Results for orders placed or performed in visit on 04/14/20   POCT Urinalysis no Micro   Result Value Ref Range    Color, UA yellow     Clarity, UA cloudy     Glucose, UA POC negative     Bilirubin, UA negative     Ketones, UA negative     Spec Grav, UA 1.015     Blood, UA POC moderate     pH, UA 6.5     Protein, UA POC negative     Urobilinogen, UA 0.2     Leukocytes, UA small     Nitrite, UA negative        Imaging:  All Radiology results interpreted by Radiologist unless otherwise noted.  No orders to display       Medical Decision Making:    Patient is well appearing, non toxic and appropriate for  outpatient management.  Plan is for symptom management and PCP follow up.     Assessment / Plan   Judy Sutton was seen today for dysuria.    Diagnoses and all orders for this visit:    Acute cystitis with hematuria  -     nitrofurantoin, macrocrystal-monohydrate, (MACROBID) 100 MG capsule; Take 1 capsule by mouth 2 times daily for 5 days  -     phenazopyridine (PYRIDIUM) 100 MG tablet; Take 1 tablet by mouth 3 times daily as needed for Pain    Dysuria  -     POCT Urinalysis no Micro  -     Culture, Urine; Future        UA appears positive for a UTI. Urine C&S pending, will call with results once available. Script written for Macrobid and Pyridium, side effects discussed. Increase fluids and rest. F/u PCP in 3-5 days if symptoms persist. ED sooner if symptoms worsen or change. ED immediately with the development of fever, shaking chills, body aches,  flank pain, vomiting, CP, or SOB. Pt is in agreement with this care plan. All questions answered.     Judy Battles, APRN - NP

## 2020-04-16 NOTE — Telephone Encounter (Signed)
Pt called in stated she saw you on [facility] and you gave her Macrobid for her UTI.  She Stated last time she had a UTI she had to have a 7 day antibiotic because the 5 day did not work.  She wanted to see if you would call it in for couple more days

## 2020-04-17 LAB — CULTURE, URINE: Urine Culture, Routine: >100000

## 2020-04-17 MED ORDER — NITROFURANTOIN MONOHYD MACRO 100 MG PO CAPS
100 MG | ORAL_CAPSULE | Freq: Two times a day (BID) | ORAL | 0 refills | Status: AC
Start: 2020-04-17 — End: 2020-04-24

## 2020-04-17 NOTE — Telephone Encounter (Signed)
I thought I put in there 7 day macrobid.  Sorry my fault

## 2020-04-17 NOTE — Other (Signed)
Urine culture is positive for UTI.  The antibiotic she is on is sensitive to the bacteria, complete as prescribed.

## 2020-04-17 NOTE — Telephone Encounter (Signed)
Was the patient taking Macrobid at that time?  Each antibiotic duration of treatment is different, urine culture was sensitive to the Macrobid.  If it was Macrobid that she took for 7 days I will call in 2 additional days worth of antibiotic, however if it was a different antibiotic I would like her to trial Macrobid and if symptoms do not improve by the last day I will call an additional medication.

## 2020-04-17 NOTE — Telephone Encounter (Signed)
Pt notified

## 2020-04-17 NOTE — Telephone Encounter (Signed)
Yes last time it was a 7 day Macrobid.  Sorry I thought I put that in there.

## 2020-04-17 NOTE — Telephone Encounter (Signed)
I called in his medication, advised patient to take for a total of 7 days.

## 2021-07-24 LAB — FECAL DNA COLORECTAL CANCER SCREENING (COLOGUARD): FIT-DNA (Cologuard): NEGATIVE

## 2021-12-11 IMAGING — MR MRI LUMBAR SPINE WITHOUT CONTRAST
5 series · 48 of 48 positions shown · non-contrast
Comparison: none

﻿

Pertinent Hx:    Low back pain.
TECHNIQUE: Sagittal images with T1, T2, and STIR weighting are performed through the lumbar spine. Axial images with T1 weighting are performed consecutively from L2 to S1. Additional axials with T2 weighting are performed from L1-L2 through L5-S1.

[Series 2: T2 · sagittal · 3.5mm · 0.81mm/px · 6 of 15 slices shown (1 of 2)]
[im 1/15]
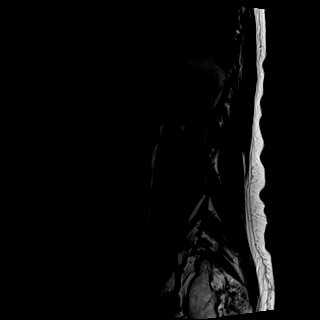
[im 3/15]
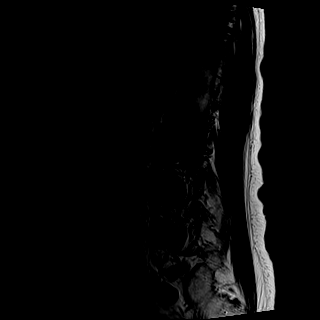
[im 6/15]
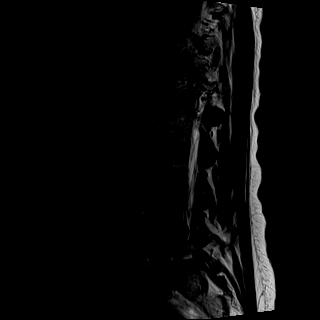
[im 9/15]
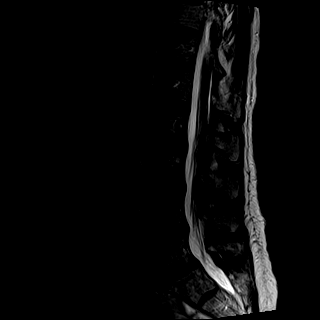
[im 12/15]
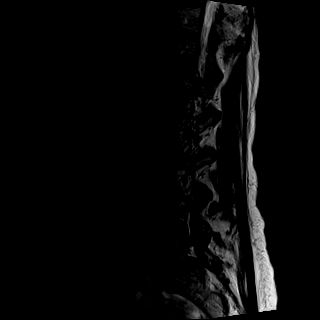
[im 15/15]
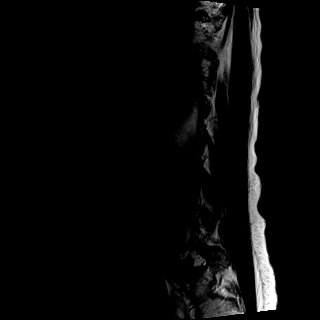

[Series 3: T1 · sagittal · 3.5mm · 0.81mm/px · 6 of 15 slices shown (1 of 2)]
[im 1/15]
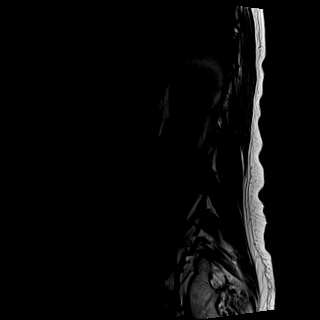
[im 3/15]
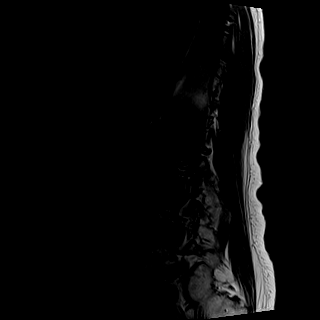
[im 6/15]
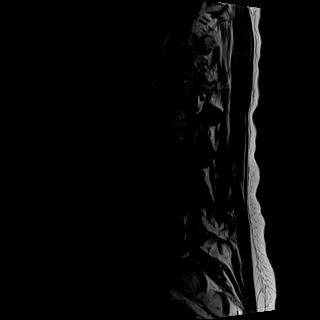
[im 9/15]
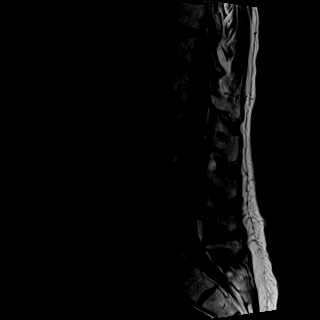
[im 12/15]
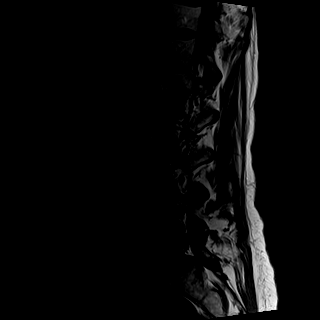
[im 15/15]
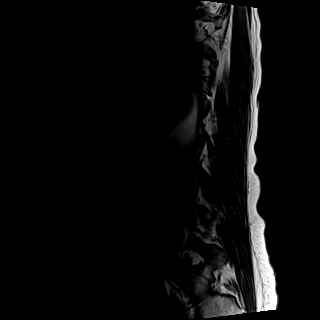

[Series 4: T2 · axial · 3.0mm · 0.62mm/px · z∈[-67,+119]mm · 15 of 37 slices shown (2 of 2)]
[im 1/37]
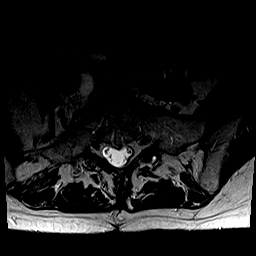
[im 3/37]
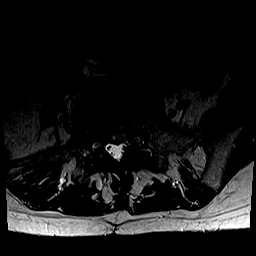
[im 6/37]
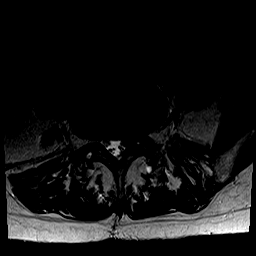
[im 8/37]
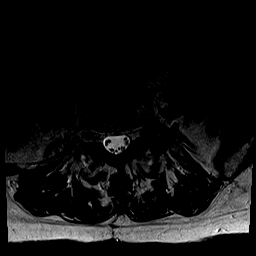
[im 11/37]
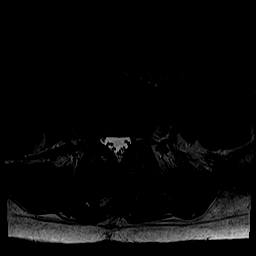
[im 13/37]
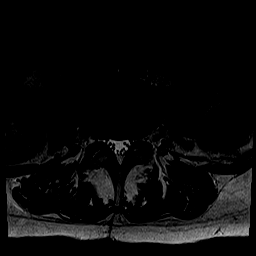
[im 16/37]
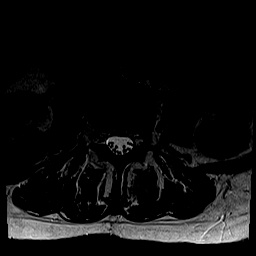
[im 19/37]
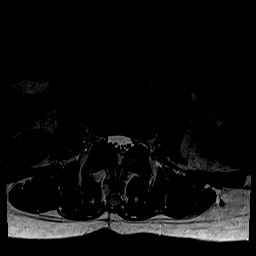
[im 21/37]
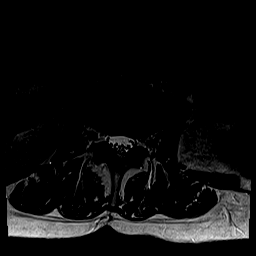
[im 24/37]
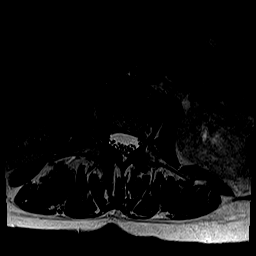
[im 26/37]
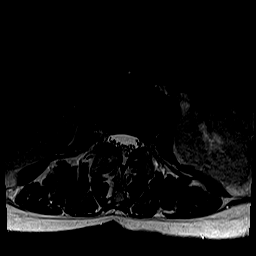
[im 29/37]
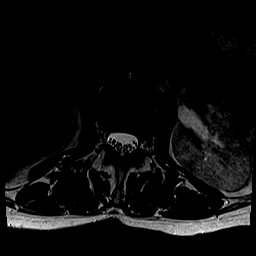
[im 31/37]
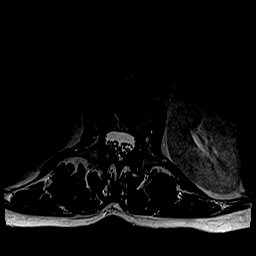
[im 34/37]
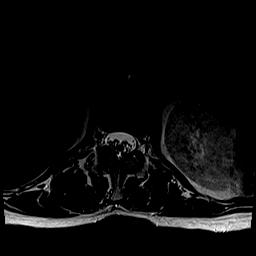
[im 37/37]
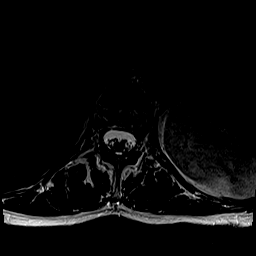

[Series 5: STIR · sagittal · 3.5mm · 1.02mm/px · 6 of 15 slices shown]
[im 1/15]
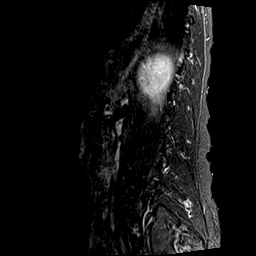
[im 3/15]
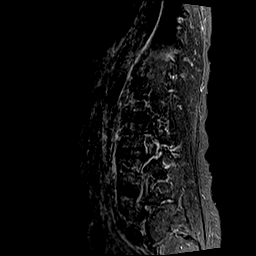
[im 6/15]
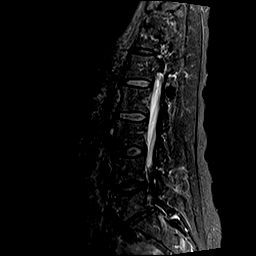
[im 9/15]
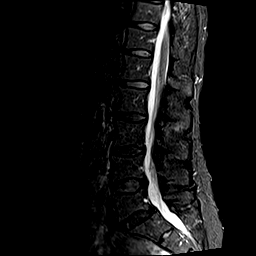
[im 12/15]
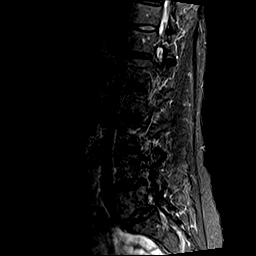
[im 15/15]
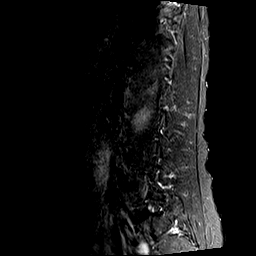

[Series 6: T1 · axial · 3.0mm · 0.62mm/px · z∈[-67,+119]mm · 15 of 37 slices shown (2 of 2)]
[im 1/37]
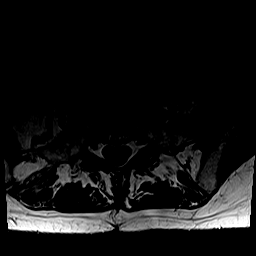
[im 3/37]
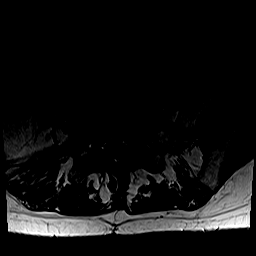
[im 6/37]
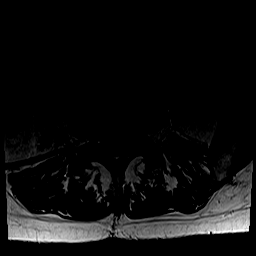
[im 8/37]
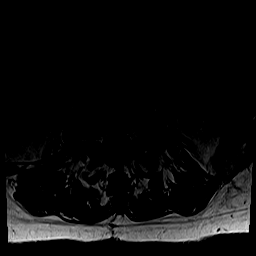
[im 11/37]
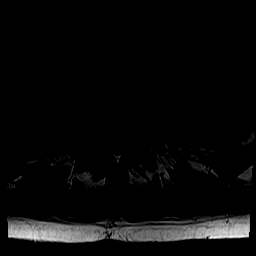
[im 13/37]
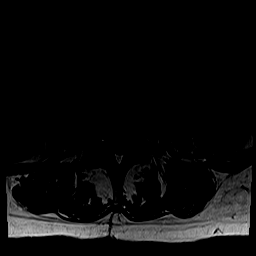
[im 16/37]
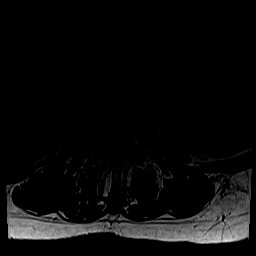
[im 19/37]
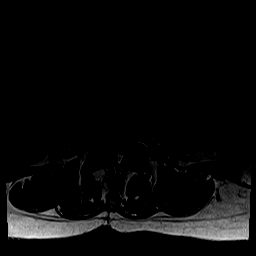
[im 21/37]
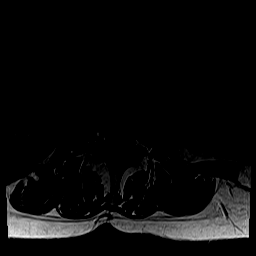
[im 24/37]
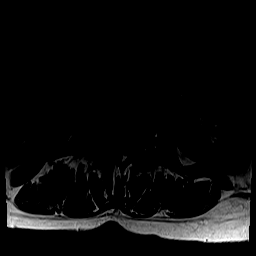
[im 26/37]
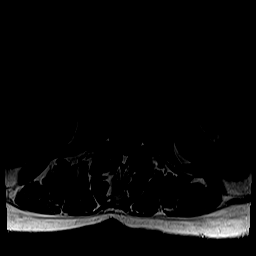
[im 29/37]
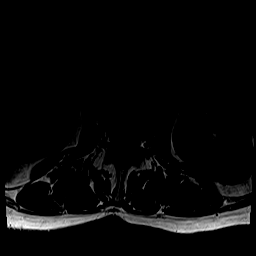
[im 31/37]
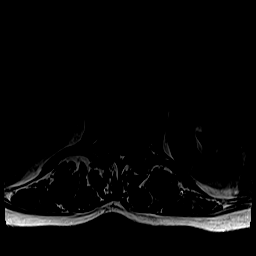
[im 34/37]
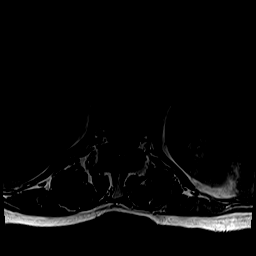
[im 37/37]
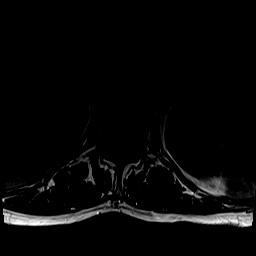

[48 of 48 positions shown; findings below may reference images not displayed]

FINDINGS: L1 to L4 remains normal.  

Slight intradiscal degenerative change is present within L4-L5 associated with a midline and biforaminal disc herniation that is mild in degree and is basically unchanged when compared to the prior scan of 03/31/2020.  The pathology at L4-L5 is mainly biforaminal stenosis.  Image 25 series 4.  

Intradiscal degenerative change is present within L5-S1 associated now with a midline and actually circumferential herniation that results slightly more canal and proximal biforaminal stenosis when compared to 03/31/2020.  The changes are slight when compared to the prior scan.
IMPRESSION: Abnormal due to the presence of extensive degenerative disease that has worsened slightly at L5-S1 where there is now a broad-based midline and biforaminal herniation aggravated by severe degenerative facet arthropathy, left greater than right.  image 32 series 4.  There is also degenerative change at L4-L5 that has not really changed when compared to the prior scan.  It produces mild biforaminal stenosis and mild canal stenosis at L4-L5.

## 2023-01-28 ENCOUNTER — Encounter: Admit: 2023-01-28 | Payer: BLUE CROSS/BLUE SHIELD | Admitting: Family

## 2023-01-28 VITALS — BP 120/70 | HR 71 | Temp 98.60000°F | Resp 18 | Ht 66.0 in | Wt 121.0 lb

## 2023-01-28 DIAGNOSIS — U071 COVID-19: Secondary | ICD-10-CM

## 2023-01-28 LAB — POCT COVID-19, ANTIGEN
Lot Number: 4141847
SARS-COV-2, POC: DETECTED — AB

## 2023-01-28 LAB — POCT RAPID STREP A: Strep A Ag: NOT DETECTED

## 2023-01-28 MED ORDER — PSEUDOEPH-BROMPHEN-DM 30-2-10 MG/5ML PO SYRP
2-30-10 | Freq: Four times a day (QID) | ORAL | 0 refills | Status: AC | PRN
Start: 2023-01-28 — End: 2023-02-07

## 2023-01-28 NOTE — Progress Notes (Signed)
 Chief Complaint   Pharyngitis      HPI   Source of history provided by: patient.      Judy Sutton is a 62 y.o. old female who presents to walk-in care for evaluation of sore throat X 3 days. Associated symptoms include chills, sore throat, nasal cong
